# Patient Record
Sex: Male | Born: 2005 | Race: Black or African American | Hispanic: No | Marital: Single | Smoking: Never smoker
Health system: Southern US, Community
[De-identification: ages and names within clinical notes are randomized; demographics above are authoritative.]

---
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
## 2024-01-09 ENCOUNTER — Ambulatory Visit (INDEPENDENT_AMBULATORY_CARE_PROVIDER_SITE_OTHER): Payer: Self-pay

## 2024-01-10 ENCOUNTER — Ambulatory Visit (HOSPITAL_BASED_OUTPATIENT_CLINIC_OR_DEPARTMENT_OTHER): Admitting: Internal Medicine

## 2024-01-18 ENCOUNTER — Telehealth (INDEPENDENT_AMBULATORY_CARE_PROVIDER_SITE_OTHER): Payer: Self-pay | Admitting: Primary Care

## 2024-01-18 NOTE — Telephone Encounter (Signed)
 Called pt to confirm appt. Pt will be present.

## 2024-01-19 ENCOUNTER — Inpatient Hospital Stay (INDEPENDENT_AMBULATORY_CARE_PROVIDER_SITE_OTHER): Admitting: Primary Care

## 2024-01-28 ENCOUNTER — Encounter (INDEPENDENT_AMBULATORY_CARE_PROVIDER_SITE_OTHER): Payer: Self-pay | Admitting: Pediatrics

## 2024-01-29 NOTE — Telephone Encounter (Signed)
 Received FYI from [facility] scheduled January 23 VV with Dr Winnie but she is checking to see if she can get him in sooner (04/19/24 at 0830 VV)

## 2024-01-29 NOTE — Telephone Encounter (Signed)
 FYI for Dr Jerolyn-- though it would be great if we could sign, we cannot sign the orders d/t medicaid pt and LOV over 1 yr ago they will not accept (needs yearly documentation/annuals), if PCP cannot sign the orders, we can discuss again at that juncture (PCP should be able to sign though)

## 2024-01-30 ENCOUNTER — Telehealth (INDEPENDENT_AMBULATORY_CARE_PROVIDER_SITE_OTHER): Payer: Self-pay

## 2024-01-30 NOTE — Telephone Encounter (Signed)
 Copied from CRM #[id number]. Topic: General - Other >> Jan 30, 2024 12:51 PM Winona SAUNDERS wrote: Lauren RN callling from [facility] calling to speak with Kellen in regards to an ongoing situation

## 2024-02-01 ENCOUNTER — Telehealth: Payer: Self-pay

## 2024-02-01 ENCOUNTER — Telehealth (INDEPENDENT_AMBULATORY_CARE_PROVIDER_SITE_OTHER): Payer: Self-pay | Admitting: Primary Care

## 2024-02-01 NOTE — Telephone Encounter (Signed)
 Copied from CRM [id number]. Topic: Clinical - Order For Equipment >> Feb 01, 2024  8:36 AM Rosaria BRAVO wrote: Reason for CRM: Kaylin called back requesting for this to be made urgent since she has not heard anything from the office. Please advise    Peri Selinda SQUIBB   01/31/2024  9:08 AM Kaylin called back stating she still has not heard from or received anything from the provider. She sent 2 separate faxes with the last correspondence being Monday 01/29/24. Please assist her as soon as possible. Please call [phone]   Kaylin from [facility] called in , states sent request for updated rx for dme supplies and office notes within a year on 10/30. Please cb if recieved

## 2024-02-01 NOTE — Telephone Encounter (Signed)
 Called pt to confirm appt. Pt will be present.

## 2024-02-02 ENCOUNTER — Telehealth (INDEPENDENT_AMBULATORY_CARE_PROVIDER_SITE_OTHER): Payer: Self-pay | Admitting: Primary Care

## 2024-02-02 ENCOUNTER — Telehealth (INDEPENDENT_AMBULATORY_CARE_PROVIDER_SITE_OTHER): Payer: Self-pay | Admitting: Pediatrics

## 2024-02-02 NOTE — Telephone Encounter (Signed)
  Name of who is calling: lauren nurse   Caller's Relationship to Patient: ped gi [facility]   Best contact number: [phone]  Provider they see: waddell   Reason for call: Yair spinal cord injury pt medical device that is being used. Last seen sept 2024 was supposed to go to a new provider, no showed. There is some orders that are needed to be sign you can give her a call back to explain further information, peirsteen device? She will be back Monday and would like a call then to go over what is needed and explain what she is needing.      PRESCRIPTION REFILL ONLY  Name of prescription:  Pharmacy:

## 2024-02-02 NOTE — Telephone Encounter (Unsigned)
 Copied from CRM #[id number]. Topic: General - Other >> Feb 02, 2024 11:10 AM Tiffini S wrote: Reason for CRM:  Lauren with [facility] called about a DME medical machine that orders need to be signed for- sounds like there are concerns about signing the forms. Per Kaylin, Lauren was suppose hear back from the nurse and needs a update today   Called CAL, answered and hung up, second call attempt to office no answer   Please call and follow up with Lauren- have been calling for week [phone]

## 2024-02-02 NOTE — Telephone Encounter (Signed)
 Returned Lauren call and made aware that pt only seen Rosaline 07/13/23 but he has been following up with Dr. Waddell and he has an appt schedule with her for 12/11. Lauren stated she apologize she was going by the pcp in chart. Made her aware that I will remove Rosaline as pcp

## 2024-02-05 NOTE — Telephone Encounter (Signed)
 Attempted to contact Lauren to gather further information on this.  Lauren unable to be reached.  LVM to call back.   SS, CCMA

## 2024-02-05 NOTE — Telephone Encounter (Addendum)
 Patient reported at last appointment he is stooling independently and no longer using the Peristeen.  Ok to not continue Peristeen for now until he is evaluated with GI.    Earnie will respond to email sent from nurse.   Corean Geralds MD MPH

## 2024-02-05 NOTE — Telephone Encounter (Signed)
 Lauren called back and stated that the patient has a Transanal Irrigation device, Peristeen, that needs a DME order signed for [facility].   Tinnie stated that her office is unable to sign the order because he has not been seen in over a year. Talor has an appointment schedule with adult GI in January of Next year, bu the will need an order signed before then so that he does not run out of supplies.   Lauren will email the Patient Care Coordinator and [facility]'s [employer], Delon Lever, briefing them both on the need for the order to be sent to our office.   SS, CCMA

## 2024-02-07 NOTE — Telephone Encounter (Signed)
 Received a response from Tinnie Raddle with [facility] that mom had informed her that mom got Nycholas to start using the Peristeen again. Lauren is going to call mom again to confirm given what Artie reported at his last appointment. She also asked when his next appointment with Dr. Waddell was, and I told her that he was scheduled on 03/07/2024.

## 2024-02-08 ENCOUNTER — Telehealth (INDEPENDENT_AMBULATORY_CARE_PROVIDER_SITE_OTHER): Payer: Self-pay

## 2024-02-08 NOTE — Telephone Encounter (Signed)
 Returned Earnie call and she had a couple of questions regarding patient. She asked about pcp and if patient can still be scheduled her and I informed her of course he can also she asked about some orders that were having issues on being signed and I made her aware that patient was only seen once and provider didn't feel comfortable signing them with only seeing him once. Earnie stated she understand and those were her only question/concerns

## 2024-02-08 NOTE — Telephone Encounter (Signed)
 Copied from CRM [id number]. Topic: General - Other >> Feb 07, 2024  3:13 PM Nathanel BROCKS wrote: Reason for CRM: Earnie pt care coordinator at [facility] care , [phone] and is wanting to know if this pt is stilling ok to schedule for this practice or has he been discharged. Please advise.
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
## 2024-03-07 ENCOUNTER — Ambulatory Visit (INDEPENDENT_AMBULATORY_CARE_PROVIDER_SITE_OTHER): Payer: Self-pay | Admitting: Pediatrics

## 2024-03-07 ENCOUNTER — Encounter (INDEPENDENT_AMBULATORY_CARE_PROVIDER_SITE_OTHER): Payer: Self-pay | Admitting: Pediatrics

## 2024-03-07 VITALS — BP 114/70 | HR 80 | Wt 116.2 lb

## 2024-03-07 DIAGNOSIS — Z792 Long term (current) use of antibiotics: Secondary | ICD-10-CM

## 2024-03-07 DIAGNOSIS — S3600XS Unspecified injury of spleen, sequela: Secondary | ICD-10-CM | POA: Diagnosis not present

## 2024-03-07 DIAGNOSIS — N319 Neuromuscular dysfunction of bladder, unspecified: Secondary | ICD-10-CM | POA: Diagnosis not present

## 2024-03-07 DIAGNOSIS — K592 Neurogenic bowel, not elsewhere classified: Secondary | ICD-10-CM

## 2024-03-07 DIAGNOSIS — I722 Aneurysm of renal artery: Secondary | ICD-10-CM | POA: Diagnosis not present

## 2024-03-07 DIAGNOSIS — S34105S Unspecified injury to L5 level of lumbar spinal cord, sequela: Secondary | ICD-10-CM | POA: Diagnosis not present

## 2024-03-07 DIAGNOSIS — L97519 Non-pressure chronic ulcer of other part of right foot with unspecified severity: Secondary | ICD-10-CM

## 2024-03-07 NOTE — Patient Instructions (Addendum)
 Symptom management: I will refill penicillin . Please talk to your PCP about refilling this in the future.  Care Coordination: Please call Rosaline Bohr for a follow up appointment, ph [phone] I recommend calling [facility] for an appointment, ph [phone]  You are scheduled with Dr. Winnie with GI on 04/19/2024 at 8:30 am. It is a telemedicine appointment. I recommend calling Lauren for questions about this appointment, ph [phone]. Referred to Physical Medicine and Rehabilitation Referred to podiatry Case management: I recommend using Medicaid transportation for your appointments, ph [phone] Referred to physical therapy Visit [email or web address] to learn more about Employment and [employer] for People with Disabilities to be able to get a driving assessment. They can also help you find a job.  Visit [email or web address]-[email or web address] to learn more about the driving assessment Referred to [facility] for case management Equipment needs: Ordered a leg brace

## 2024-03-11 MED ORDER — PENICILLIN V POTASSIUM 250 MG PO TABS: 250.0000 mg | ORAL_TABLET | Freq: Four times a day (QID) | ORAL | 0 refills | Status: AC

## 2024-03-26 ENCOUNTER — Ambulatory Visit

## 2024-03-27 ENCOUNTER — Encounter (INDEPENDENT_AMBULATORY_CARE_PROVIDER_SITE_OTHER): Payer: Self-pay | Admitting: Pediatrics

## 2024-03-29 ENCOUNTER — Telehealth: Payer: Self-pay

## 2024-03-29 NOTE — Progress Notes (Signed)
 Complex Care Management Note Care Guide Note  03/29/2024 Name: Benjamin Houston MRN: [id number] DOB: [birth year]   Complex Care Management Outreach Attempts: An unsuccessful telephone outreach was attempted today to offer the patient information about available complex care management services.  Follow Up Plan:  Additional outreach attempts will be made to offer the patient complex care management information and services.   Encounter Outcome:  No Answer-Left voicemail  Leotis Rase [facility], [facility] Guide  Direct Dial: [phone]  Fax [phone]

## 2024-04-01 ENCOUNTER — Encounter (INDEPENDENT_AMBULATORY_CARE_PROVIDER_SITE_OTHER): Payer: Self-pay

## 2024-04-02 NOTE — Progress Notes (Signed)
 Complex Care Management Note Care Guide Note  04/02/2024 Name: Benjamin Houston MRN: [id number] DOB: 2006/01/30   Complex Care Management Outreach Attempts: A third unsuccessful outreach was attempted today to offer the patient with information about available complex care management services.  Follow Up Plan:  No further outreach attempts will be made at this time. We have been unable to contact the patient to offer or enroll patient in complex care management services.  Encounter Outcome:  No Answer-Left voicemail  Leotis Rase [facility], [facility] Guide  Direct Dial: [phone]  Fax [phone]

## 2024-04-05 ENCOUNTER — Encounter: Payer: Self-pay | Admitting: Physical Medicine and Rehabilitation

## 2024-04-10 ENCOUNTER — Encounter: Attending: Physical Medicine and Rehabilitation | Admitting: Physical Medicine and Rehabilitation

## 2024-06-06 ENCOUNTER — Ambulatory Visit (INDEPENDENT_AMBULATORY_CARE_PROVIDER_SITE_OTHER): Payer: Self-pay | Admitting: Pediatrics
